# Patient Record
Sex: Female | Born: 1985 | Race: White | Hispanic: No | Marital: Married | Smoking: Former smoker
Health system: Southern US, Community
[De-identification: ages and names within clinical notes are randomized; demographics above are authoritative.]

## PROBLEM LIST (undated history)

## (undated) DIAGNOSIS — G43909 Migraine, unspecified, not intractable, without status migrainosus: Secondary | ICD-10-CM

## (undated) DIAGNOSIS — N8110 Cystocele, unspecified: Secondary | ICD-10-CM

## (undated) DIAGNOSIS — K121 Other forms of stomatitis: Secondary | ICD-10-CM

## (undated) DIAGNOSIS — K219 Gastro-esophageal reflux disease without esophagitis: Secondary | ICD-10-CM

## (undated) HISTORY — PX: NISSEN FUNDOPLICATION: SHX2091

## (undated) HISTORY — PX: LAPAROTOMY: SHX154

## (undated) HISTORY — PX: TONSILLECTOMY: SUR1361

## (undated) HISTORY — PX: LAPAROSCOPIC ENDOMETRIOSIS FULGURATION: SUR769

---

## 2014-12-31 ENCOUNTER — Other Ambulatory Visit: Payer: Self-pay | Admitting: Physician Assistant

## 2014-12-31 DIAGNOSIS — R1084 Generalized abdominal pain: Secondary | ICD-10-CM

## 2015-01-06 ENCOUNTER — Ambulatory Visit
Admission: RE | Admit: 2015-01-06 | Discharge: 2015-01-06 | Disposition: A | Discharge status: Home / Self Care | Payer: Managed Care, Other (non HMO) | Source: Ambulatory Visit | Attending: Physician Assistant | Admitting: Physician Assistant

## 2015-01-06 DIAGNOSIS — R1084 Generalized abdominal pain: Secondary | ICD-10-CM

## 2015-01-06 IMAGING — RF DG UGI W/ HIGH DENSITY W/KUB
18 series · 18 of 18 positions shown · non-contrast
Comparison: None.

CLINICAL DATA: Abdominal pain after eating. History of Nissen
fundoplication.

EXAM:
UPPER GI SERIES WITH KUB
TECHNIQUE: After obtaining a scout radiograph a routine upper GI series was
performed using thin and high density barium.
FLUOROSCOPY TIME:  Radiation Exposure Index (as provided by the
fluoroscopic device): 42 dGy
Fluoroscopy Time (in minutes and seconds):  2 minutes 0 seconds

[Series 1: run · 1 of 1 slices shown (1 of 17)]
[im 1/1]
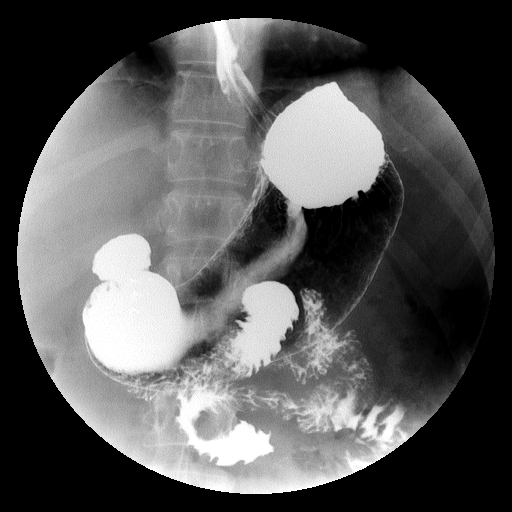

[Series 2: run · 1 of 1 slices shown (2 of 17)]
[im 1/1]
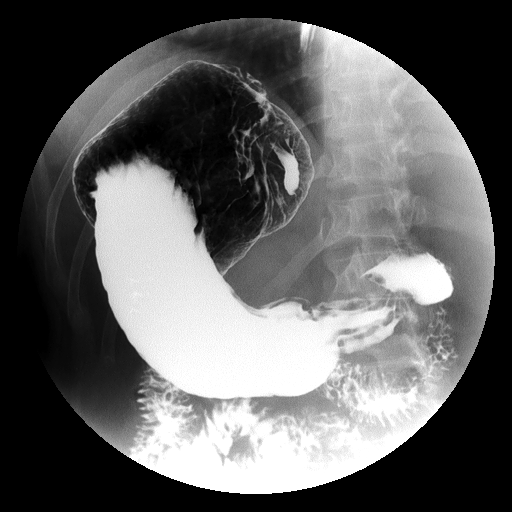

[Series 3: run · 1 of 1 slices shown (3 of 17)]
[im 1/1]
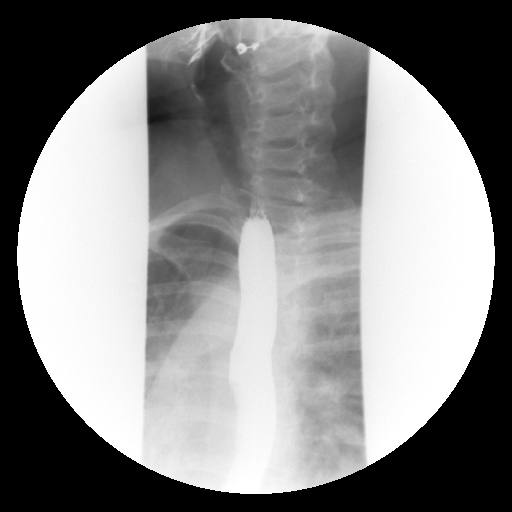

[Series 4: run · 1 of 1 slices shown (4 of 17)]
[im 1/1]
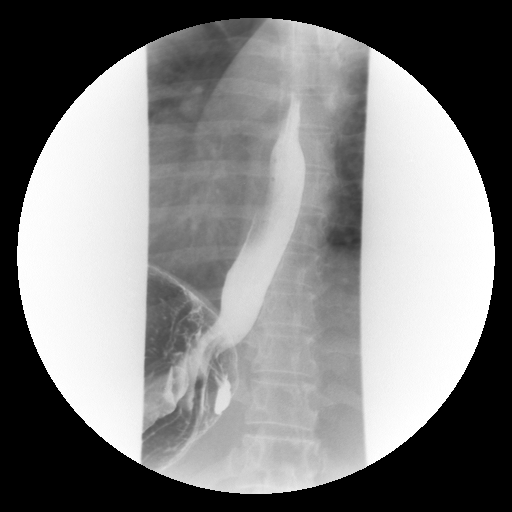

[Series 5: run · 1 of 1 slices shown (5 of 17)]
[im 1/1]
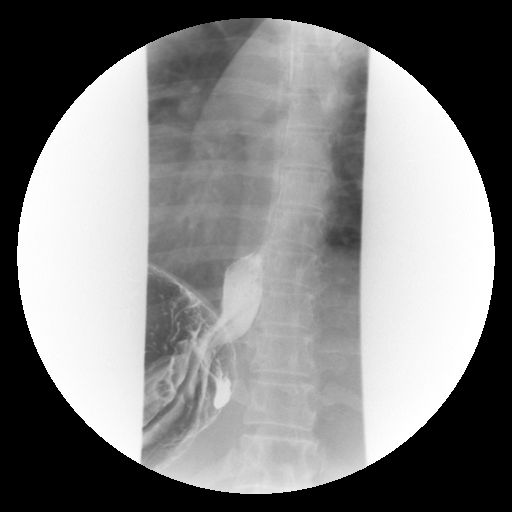

[Series 6: run · 1 of 1 slices shown (6 of 17)]
[im 1/1]
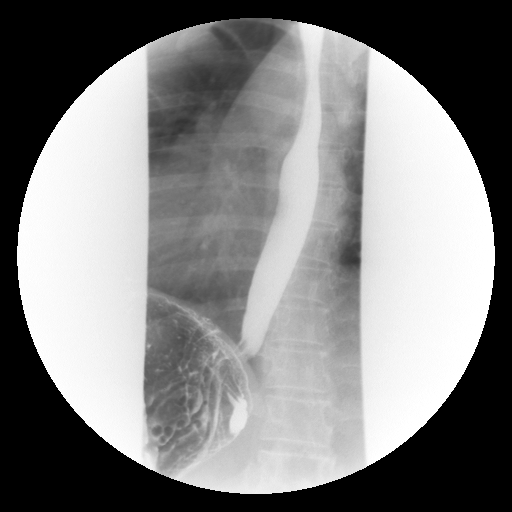

[Series 7: run · 1 of 1 slices shown (7 of 17)]
[im 1/1]
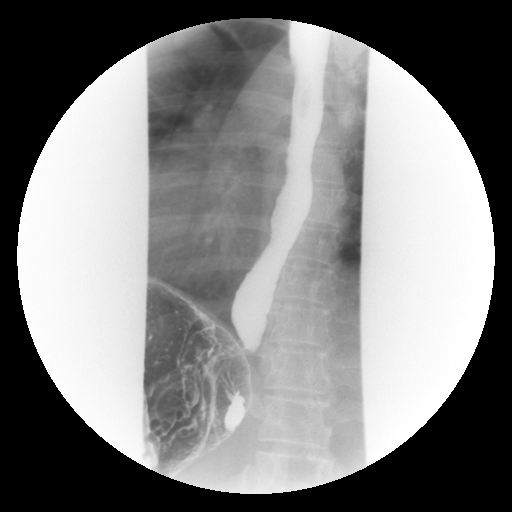

[Series 8: run · 1 of 1 slices shown (8 of 17)]
[im 1/1]
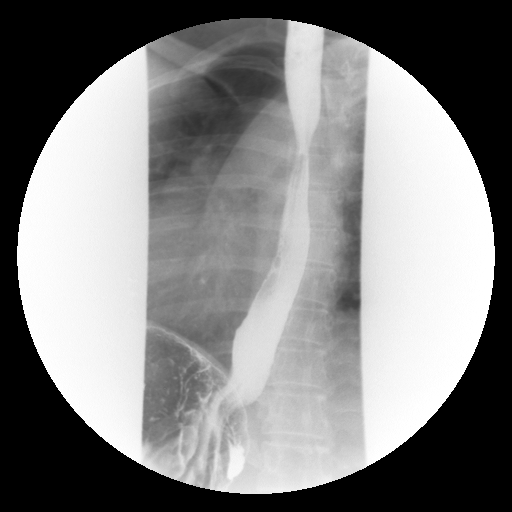

[Series 9: run · 1 of 1 slices shown (9 of 17)]
[im 1/1]
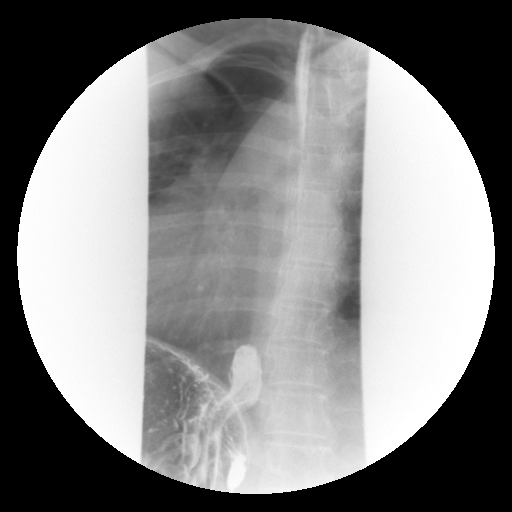

[Series 10: run · 1 of 1 slices shown (10 of 17)]
[im 1/1]
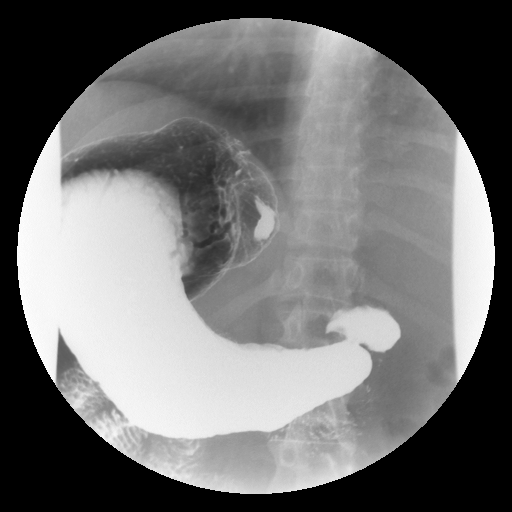

[Series 11: run · 1 of 1 slices shown (11 of 17)]
[im 1/1]
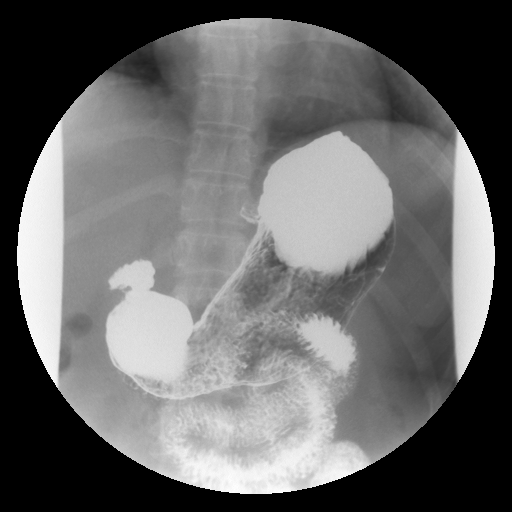

[Series 12: run · 1 of 1 slices shown (12 of 17)]
[im 1/1]
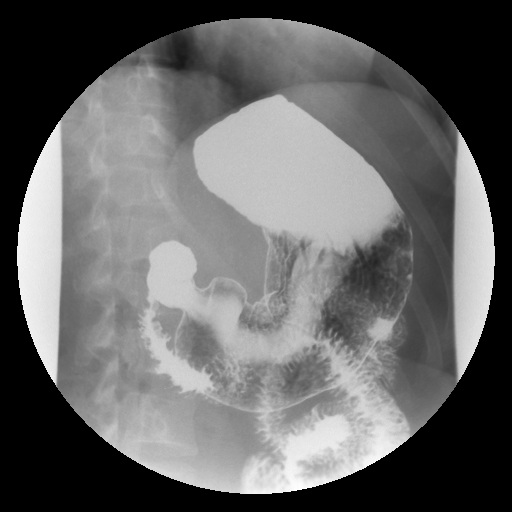

[Series 13: run · 1 of 1 slices shown (13 of 17)]
[im 1/1]
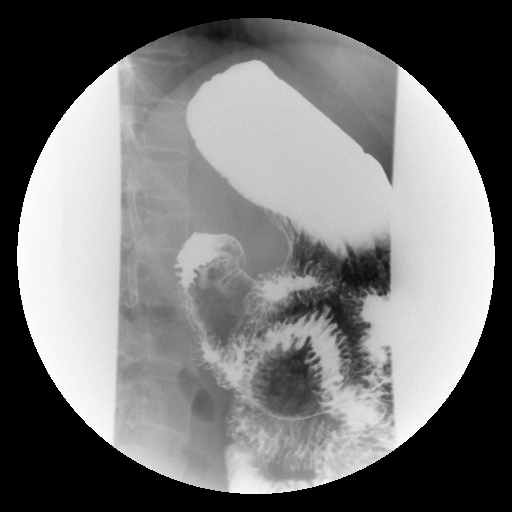

[Series 14: run · 1 of 1 slices shown (14 of 17)]
[im 1/1]
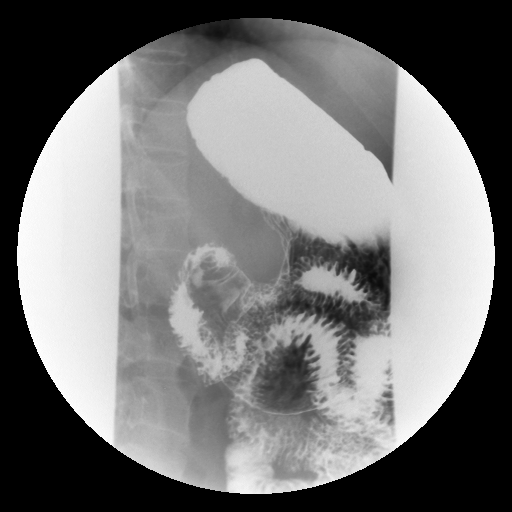

[Series 15: run · 1 of 1 slices shown (15 of 17)]
[im 1/1]
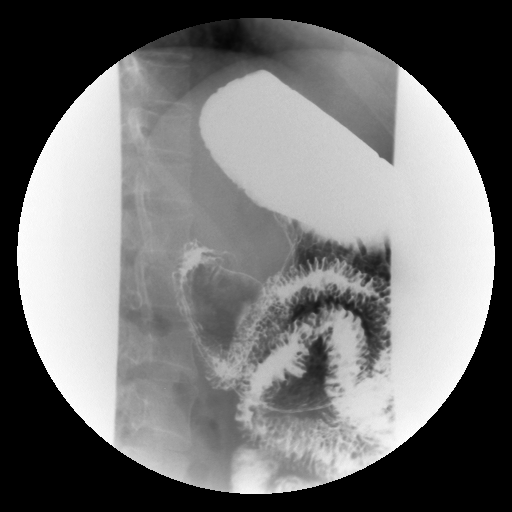

[Series 16: run · 1 of 1 slices shown (16 of 17)]
[im 1/1]
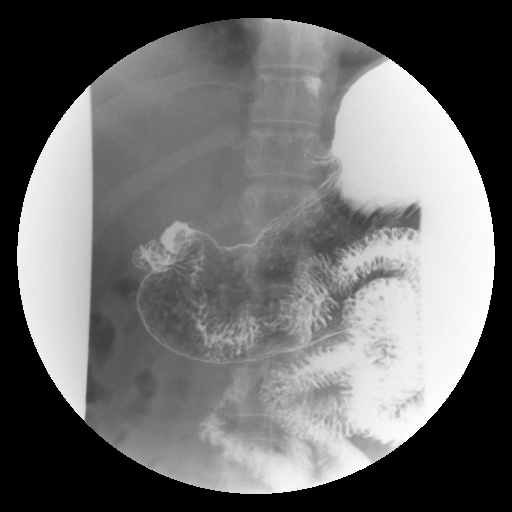

[Series 17: run · 1 of 1 slices shown (17 of 17)]
[im 1/1]
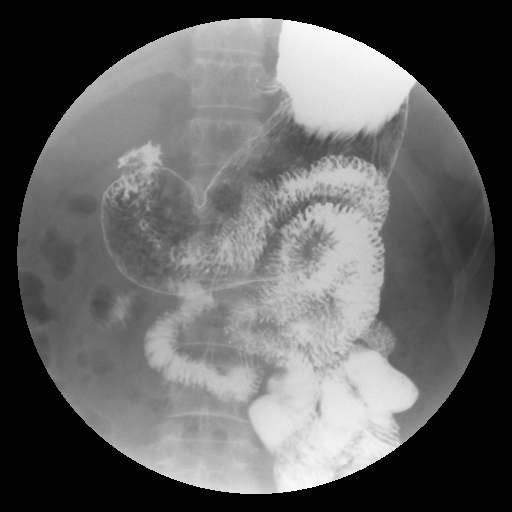

[Series 1001: view not recorded · 0.20mm/px · 1 of 1 slices shown]
[im 1/1]
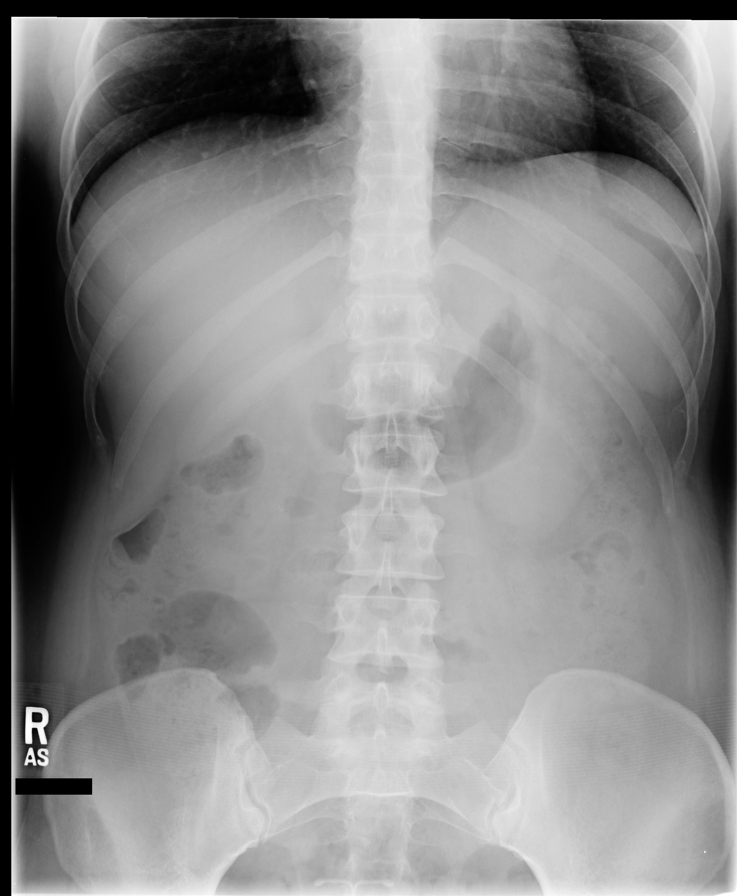

[18 of 18 positions shown; findings below may reference images not displayed]

FINDINGS: The mucosa and motility of the esophagus are normal. No hiatal
hernia. Nissen fundoplication appears intact. The stomach otherwise
appears normal. Pylorus and duodenal bulb and duodenal C-loop are
normal. Proximal jejunum is normal.
IMPRESSION: Intact fundoplication with no recurrent hiatal hernia. No
spontaneous gastroesophageal reflux or visible esophagitis.

Otherwise, normal exam.

## 2015-01-13 ENCOUNTER — Other Ambulatory Visit: Payer: Self-pay | Admitting: Physician Assistant

## 2015-01-13 DIAGNOSIS — R1084 Generalized abdominal pain: Secondary | ICD-10-CM

## 2015-01-16 ENCOUNTER — Ambulatory Visit
Admission: RE | Admit: 2015-01-16 | Discharge: 2015-01-16 | Disposition: A | Discharge status: Home / Self Care | Payer: Managed Care, Other (non HMO) | Source: Ambulatory Visit | Attending: Physician Assistant | Admitting: Physician Assistant

## 2015-01-16 DIAGNOSIS — R1084 Generalized abdominal pain: Secondary | ICD-10-CM

## 2018-12-07 ENCOUNTER — Ambulatory Visit (HOSPITAL_BASED_OUTPATIENT_CLINIC_OR_DEPARTMENT_OTHER): Admit: 2018-12-07 | Payer: Managed Care, Other (non HMO) | Admitting: Obstetrics and Gynecology

## 2018-12-07 ENCOUNTER — Encounter (HOSPITAL_BASED_OUTPATIENT_CLINIC_OR_DEPARTMENT_OTHER): Payer: Self-pay

## 2018-12-07 SURGERY — ANTERIOR (CYSTOCELE) AND POSTERIOR REPAIR (RECTOCELE)
Anesthesia: General | Laterality: Bilateral

## 2019-01-01 NOTE — Patient Instructions (Addendum)
YOU ARE REQUIRED TO BE TESTED FOR COVID-19 PRIOR TO YOUR SURGERY . YOUR TEST MUST BE COMPLETED ON Saturday June 20th. TESTING IS LOCATED [facility] FROM 9:00AM - 12:00PM. FAILURE TO COMPLETE TESTING MAY RESULT IN CANCELLATION OF YOUR SURGERY.     Savannah Bolton      Your procedure is scheduled on 01-10-2019   [facility].   Call this number if you have problems the morning of [phone]    OUR ADDRESS IS [address], WE ARE LOCATED IN THE [facility]ICAL [facility] WITH [facility].   Remember: Do not eat food After Midnight. YOU MAY HAVE CLEAR LIQUIDS FROM MIDNIGHT UNTIL 4:30AM. At 4:30AM Please finish the prescribed Pre-Surgery ENSURE drink. Nothing by mouth after you finish the ENSURE drink !    Take these medicines the morning of surgery with A SIP OF WATER :NONE           CLEAR LIQUID DIET   Foods Allowed                                                                     Foods Excluded  Coffee and tea, regular and decaf                             liquids that you cannot  Plain Jell-O in any flavor                                             see through such as: Fruit ices (not with fruit pulp)                                     milk, soups, orange juice  Iced Popsicles                                    All solid food Carbonated beverages, regular and diet                                    Cranberry, grape and apple juices Sports drinks like Gatorade Lightly seasoned clear broth or consume(fat free) Sugar, honey syrup  Sample Menu Breakfast                                Lunch                                     Supper Cranberry juice                    Beef broth  Chicken broth Jell-O                                     Grape juice                           Apple juice Coffee or tea                        Jell-O                                       Popsicle                                                Coffee or tea                        Coffee or tea  _____________________________________________________________________    Do not wear jewelry, make-up or nail polish.  Do not wear lotions, powders, or perfumes, or deoderant.  Do not shave 48 hours prior to surgery.  Men may shave face and neck.  Do not bring valuables to the hospital.  [facility] any belongings or valuables.  Contacts, dentures or bridgework may not be worn into surgery.  Leave your suitcase in the car.  After surgery it may be brought to your room.  For patients admitted to the hospital, discharge time will be determined by your treatment team.  Patients discharged the day of surgery will not be allowed to drive home.   Special instructions: PLEASE BRING ALL PRESCIPTION MEDICATIONS WITH YOU IN THE ORIGINAL CONTAINERS, IF YOU ARE TAKING ANY PRESCRIPTIONS MEDICATIONS. NO VISITORS ARE ALLOWED TO SPEND THE NIGHT.  Please read over the following fact sheets that you were given:   [facility] - Preparing for [facility] Before surgery, you can play an important role.  Because skin is not sterile, your skin needs to be as free of germs as possible.  You can reduce the number of germs on your skin by washing with CHG (chlorahexidine gluconate) soap before surgery.  CHG is an antiseptic cleaner which kills germs and bonds with the skin to continue killing germs even after washing. Please DO NOT use if you have an allergy to CHG or antibacterial soaps.  If your skin becomes reddened/irritated stop using the CHG and inform your nurse when you arrive at Short Stay. Do not shave (including legs and underarms) for at least 48 hours prior to the first CHG shower.  You may shave your face/neck. Please follow these instructions carefully:  1.  Shower with CHG Soap the night before surgery and the  morning of Surgery.  2.  If you choose to wash your hair,  wash your hair first as usual with your  normal  shampoo.  3.  After you shampoo, rinse your hair and body thoroughly to remove the  shampoo.                           4.  Use CHG as you would any other liquid soap.  You can apply chg directly  to the skin and wash                       Gently with a scrungie or clean washcloth.  5.  Apply the CHG Soap to your body ONLY FROM THE NECK DOWN.   Do not use on face/ open                           Wound or open sores. Avoid contact with eyes, ears mouth and genitals (private parts).                       Wash face,  Genitals (private parts) with your normal soap.             6.  Wash thoroughly, paying special attention to the area where your surgery  will be performed.  7.  Thoroughly rinse your body with warm water from the neck down.  8.  DO NOT shower/wash with your normal soap after using and rinsing off  the CHG Soap.                9.  Pat yourself dry with a clean towel.            10.  Wear clean pajamas.            11.  Place clean sheets on your bed the night of your first shower and do not  sleep with pets. Day of Surgery : Do not apply any lotions/deodorants the morning of surgery.  Please wear clean clothes to the hospital/surgery center.  FAILURE TO FOLLOW THESE INSTRUCTIONS MAY RESULT IN THE CANCELLATION OF YOUR SURGERY PATIENT SIGNATURE_________________________________  NURSE SIGNATURE__________________________________  ________________________________________________________________________   [name]  An incentive spirometer is a tool that can help keep your lungs clear and active. This tool measures how well you are filling your lungs with each breath. Taking long deep breaths may help reverse or decrease the chance of developing breathing (pulmonary) problems (especially infection) following:  A long period of time when you are unable to move or be active. BEFORE THE PROCEDURE   If the spirometer includes an  indicator to show your best effort, your nurse or respiratory therapist will set it to a desired goal.  If possible, sit up straight or lean slightly forward. Try not to slouch.  Hold the incentive spirometer in an upright position. INSTRUCTIONS FOR USE  1. Sit on the edge of your bed if possible, or sit up as far as you can in bed or on a chair. 2. Hold the incentive spirometer in an upright position. 3. Breathe out normally. 4. Place the mouthpiece in your mouth and seal your lips tightly around it. 5. Breathe in slowly and as deeply as possible, raising the piston or the ball toward the top of the column. 6. Hold your breath for 3-5 seconds or for as long as possible. Allow the piston or ball to fall to the bottom of the column. 7. Remove the mouthpiece from your mouth and breathe out normally. 8. Rest for a few seconds and repeat Steps 1 through 7 at least 10 times every 1-2 hours when you are awake. Take your time and take a few normal breaths between deep breaths. 9. The spirometer may include an indicator to show your best effort. Use the indicator as a goal to work toward  during each repetition. 10. After each set of 10 deep breaths, practice coughing to be sure your lungs are clear. If you have an incision (the cut made at the time of surgery), support your incision when coughing by placing a pillow or rolled up towels firmly against it. Once you are able to get out of bed, walk around indoors and cough well. You may stop using the incentive spirometer when instructed by your caregiver.  RISKS AND COMPLICATIONS  Take your time so you do not get dizzy or light-headed.  If you are in pain, you may need to take or ask for pain medication before doing incentive spirometry. It is harder to take a deep breath if you are having pain. AFTER USE  Rest and breathe slowly and easily.  It can be helpful to keep track of a log of your progress. Your caregiver can provide you with a simple table  to help with this. If you are using the spirometer at home, follow these instructions: SEEK MEDICAL CARE IF:   You are having difficultly using the spirometer.  You have trouble using the spirometer as often as instructed.  Your pain medication is not giving enough relief while using the spirometer.  You develop fever of 100.5 F (38.1 C) or higher. SEEK IMMEDIATE MEDICAL CARE IF:   You cough up bloody sputum that had not been present before.  You develop fever of 102 F (38.9 C) or greater.  You develop worsening pain at or near the incision site. MAKE SURE YOU:   Understand these instructions.  Will watch your condition.  Will get help right away if you are not doing well or get worse. Document Released: 11/15/2006 Document Revised: 09/27/2011 Document Reviewed: 01/16/2007 [facility] Patient Information 2014 [employer] [place].   ________________________________________________________________________

## 2019-01-02 ENCOUNTER — Encounter (HOSPITAL_COMMUNITY): Payer: Self-pay

## 2019-01-02 ENCOUNTER — Encounter (HOSPITAL_COMMUNITY)
Admission: RE | Admit: 2019-01-02 | Discharge: 2019-01-02 | Disposition: A | Discharge status: Home / Self Care | Payer: 59 | Source: Ambulatory Visit | Attending: Obstetrics and Gynecology | Admitting: Obstetrics and Gynecology

## 2019-01-02 ENCOUNTER — Other Ambulatory Visit: Payer: Self-pay

## 2019-01-02 DIAGNOSIS — Z01812 Encounter for preprocedural laboratory examination: Secondary | ICD-10-CM | POA: Insufficient documentation

## 2019-01-02 DIAGNOSIS — N8110 Cystocele, unspecified: Secondary | ICD-10-CM | POA: Diagnosis not present

## 2019-01-02 DIAGNOSIS — R102 Pelvic and perineal pain: Secondary | ICD-10-CM | POA: Diagnosis not present

## 2019-01-02 HISTORY — DX: Cystocele, unspecified: N81.10

## 2019-01-02 HISTORY — DX: Gastro-esophageal reflux disease without esophagitis: K21.9

## 2019-01-02 HISTORY — DX: Other forms of stomatitis: K12.1

## 2019-01-02 HISTORY — DX: Migraine, unspecified, not intractable, without status migrainosus: G43.909

## 2019-01-02 LAB — CBC
HCT: 42.3 % (ref 36.0–46.0)
Hemoglobin: 14 g/dL (ref 12.0–15.0)
MCH: 30.7 pg (ref 26.0–34.0)
MCHC: 33.1 g/dL (ref 30.0–36.0)
MCV: 92.8 fL (ref 80.0–100.0)
Platelets: 233 10*3/uL (ref 150–400)
RBC: 4.56 MIL/uL (ref 3.87–5.11)
RDW: 13.1 % (ref 11.5–15.5)
WBC: 8.3 10*3/uL (ref 4.0–10.5)
nRBC: 0 % (ref 0.0–0.2)

## 2019-01-06 ENCOUNTER — Other Ambulatory Visit (HOSPITAL_COMMUNITY)
Admission: RE | Admit: 2019-01-06 | Discharge: 2019-01-06 | Disposition: A | Discharge status: Home / Self Care | Payer: 59 | Source: Ambulatory Visit | Attending: Obstetrics and Gynecology | Admitting: Obstetrics and Gynecology

## 2019-01-06 DIAGNOSIS — Z1159 Encounter for screening for other viral diseases: Secondary | ICD-10-CM | POA: Diagnosis not present

## 2019-01-07 LAB — SARS CORONAVIRUS 2 (TAT 6-24 HRS): SARS Coronavirus 2: NEGATIVE

## 2019-01-09 NOTE — H&P (Signed)
Savannah Bolton is an 33 y.o. female.  Since Savannah Bolton 2 years ago, bladder has dropped, has some vaginal pressure, frequent random urine leak-wears panty liner and has to change several times per day.  Has Mirena, over the past 6 months, started having light menses monthly, then over the past couple of months bleeding every 2 weeks. Has had some random cramps. Recent had worse midline pelvic pain, some better after ibuprofen and tylenol. Pain was 8/10, now 3/10. No specific aggravating or relieving factors. Normal urination and BM. No F/C, some nausea with pain, no emesis. No unusual discharge or irritation. Last coitus 2-3 months ago, no problems then.  She was initially scheduled for A&P repair and BTL, but with increased bleeding and pain, wants definitive surgical management of that as well.  Pertinent Gynecological History: Last pap: normal Date: 2017, pending from 01/01/2019 OB History: G2, P2002 SVD x 2   Menstrual History: Patient's last menstrual period was 12/19/2018.    Past Medical History:  Diagnosis Date  . Female cystocele   . GERD (gastroesophageal reflux disease)   . Migraines   . Mouth ulcers    due to high acid buildup leads to mouth ulcers, reports healing now     No family history on file.  Social History:  reports that she quit smoking about 3 years ago. Her smoking use included cigarettes. She has never used smokeless tobacco. She reports current alcohol use. No history on file for drug.  Allergies: No Known Allergies  No medications prior to admission.    Review of Systems  Respiratory: Negative.   Cardiovascular: Negative.     Last menstrual period 12/19/2018. Physical Exam  Constitutional: She appears well-developed and well-nourished.  Neck: Neck supple. No thyromegaly present.  Cardiovascular: Normal rate, regular rhythm and normal heart sounds.  No murmur heard. Respiratory: Effort normal and breath sounds normal. No respiratory distress. She has no  wheezes.  GI: Soft. She exhibits no distension and no mass. There is no abdominal tenderness.  Genitourinary:    Uterus normal.     Genitourinary Comments: Gr II cystocele, Gr I rectocele No adnexal mass     No results found for this or any previous visit (from the past 24 hour(s)).  No results found.  Assessment/Plan: Symptomatic cystocele, small rectocele, irregular bleeding and increasing pelvic pain.  All medical and surgical options have been discussed.  She wants definitive therapy.  Surgical procedure, risks, chances of relieving symptoms have all been discussed.  Will admit for LAVH,bilateral salpingectomy,  A&P repair, cystoscopy.    Blane Ohara Savannah Bolton 01/09/2019, 7:33 PM

## 2019-01-10 ENCOUNTER — Ambulatory Visit (HOSPITAL_BASED_OUTPATIENT_CLINIC_OR_DEPARTMENT_OTHER)
Admission: RE | Admit: 2019-01-10 | Discharge: 2019-01-11 | Disposition: A | Discharge status: Home / Self Care | Payer: 59 | Attending: Obstetrics and Gynecology | Admitting: Obstetrics and Gynecology

## 2019-01-10 ENCOUNTER — Other Ambulatory Visit: Payer: Self-pay

## 2019-01-10 ENCOUNTER — Ambulatory Visit (HOSPITAL_BASED_OUTPATIENT_CLINIC_OR_DEPARTMENT_OTHER): Payer: 59 | Admitting: Anesthesiology

## 2019-01-10 ENCOUNTER — Encounter (HOSPITAL_BASED_OUTPATIENT_CLINIC_OR_DEPARTMENT_OTHER): Payer: Self-pay

## 2019-01-10 ENCOUNTER — Ambulatory Visit (HOSPITAL_BASED_OUTPATIENT_CLINIC_OR_DEPARTMENT_OTHER): Payer: 59 | Admitting: Physician Assistant

## 2019-01-10 ENCOUNTER — Encounter (HOSPITAL_BASED_OUTPATIENT_CLINIC_OR_DEPARTMENT_OTHER)
Admission: RE | Disposition: A | Discharge status: Home / Self Care | Payer: Self-pay | Source: Home / Self Care | Attending: Obstetrics and Gynecology

## 2019-01-10 DIAGNOSIS — N816 Rectocele: Secondary | ICD-10-CM | POA: Diagnosis not present

## 2019-01-10 DIAGNOSIS — N814 Uterovaginal prolapse, unspecified: Secondary | ICD-10-CM | POA: Diagnosis present

## 2019-01-10 DIAGNOSIS — Z87891 Personal history of nicotine dependence: Secondary | ICD-10-CM | POA: Diagnosis not present

## 2019-01-10 DIAGNOSIS — N800 Endometriosis of uterus: Secondary | ICD-10-CM | POA: Diagnosis not present

## 2019-01-10 DIAGNOSIS — R102 Pelvic and perineal pain: Secondary | ICD-10-CM | POA: Diagnosis present

## 2019-01-10 DIAGNOSIS — Z90710 Acquired absence of both cervix and uterus: Secondary | ICD-10-CM | POA: Diagnosis present

## 2019-01-10 DIAGNOSIS — N8110 Cystocele, unspecified: Secondary | ICD-10-CM | POA: Diagnosis not present

## 2019-01-10 DIAGNOSIS — N838 Other noninflammatory disorders of ovary, fallopian tube and broad ligament: Secondary | ICD-10-CM | POA: Insufficient documentation

## 2019-01-10 DIAGNOSIS — N939 Abnormal uterine and vaginal bleeding, unspecified: Secondary | ICD-10-CM | POA: Insufficient documentation

## 2019-01-10 HISTORY — PX: ANTERIOR AND POSTERIOR REPAIR: SHX5121

## 2019-01-10 HISTORY — PX: CYSTOSCOPY: SHX5120

## 2019-01-10 HISTORY — PX: LAPAROSCOPIC VAGINAL HYSTERECTOMY WITH SALPINGECTOMY: SHX6680

## 2019-01-10 LAB — POCT PREGNANCY, URINE: Preg Test, Ur: NEGATIVE

## 2019-01-10 SURGERY — ANTERIOR (CYSTOCELE) AND POSTERIOR REPAIR (RECTOCELE)
Anesthesia: General | Site: Vagina

## 2019-01-10 MED ORDER — MIDAZOLAM HCL 2 MG/2ML IJ SOLN
INTRAMUSCULAR | Status: DC | PRN
  Administered 2019-01-10: 2 mg via INTRAVENOUS

## 2019-01-10 MED ORDER — DEXAMETHASONE SODIUM PHOSPHATE 10 MG/ML IJ SOLN
INTRAMUSCULAR | Status: DC | PRN
  Administered 2019-01-10: 10 mg via INTRAVENOUS

## 2019-01-10 MED ORDER — SUGAMMADEX SODIUM 200 MG/2ML IV SOLN
INTRAVENOUS | Status: DC | PRN
  Administered 2019-01-10: 100 mg via INTRAVENOUS

## 2019-01-10 MED ORDER — HYDROMORPHONE HCL 1 MG/ML IJ SOLN
1.0000 mg | Freq: Once | INTRAMUSCULAR | Status: AC | PRN
  Administered 2019-01-10: 14:00:00 1 mg via INTRAVENOUS
  Filled 2019-01-10: qty 1

## 2019-01-10 MED ORDER — ACETAMINOPHEN 500 MG PO TABS
ORAL_TABLET | ORAL | Status: AC
  Filled 2019-01-10: qty 2

## 2019-01-10 MED ORDER — DEXAMETHASONE SODIUM PHOSPHATE 10 MG/ML IJ SOLN
INTRAMUSCULAR | Status: AC
  Filled 2019-01-10: qty 1

## 2019-01-10 MED ORDER — FLUORESCEIN SODIUM 10 % IV SOLN
INTRAVENOUS | Status: AC
  Filled 2019-01-10: qty 5

## 2019-01-10 MED ORDER — HYDROMORPHONE HCL 1 MG/ML IJ SOLN
INTRAMUSCULAR | Status: AC
  Filled 2019-01-10: qty 1

## 2019-01-10 MED ORDER — BUPIVACAINE HCL (PF) 0.5 % IJ SOLN
INTRAMUSCULAR | Status: DC | PRN
  Administered 2019-01-10: 7.5 mL

## 2019-01-10 MED ORDER — MIDAZOLAM HCL 2 MG/2ML IJ SOLN
INTRAMUSCULAR | Status: AC
  Filled 2019-01-10: qty 2

## 2019-01-10 MED ORDER — FENTANYL CITRATE (PF) 100 MCG/2ML IJ SOLN
INTRAMUSCULAR | Status: AC
  Filled 2019-01-10: qty 2

## 2019-01-10 MED ORDER — ONDANSETRON HCL 4 MG PO TABS
4.0000 mg | ORAL_TABLET | Freq: Four times a day (QID) | ORAL | Status: DC | PRN
  Filled 2019-01-10: qty 1

## 2019-01-10 MED ORDER — VASOPRESSIN 20 UNIT/ML IV SOLN
INTRAVENOUS | Status: DC | PRN
  Administered 2019-01-10: 13 mL via INTRAMUSCULAR

## 2019-01-10 MED ORDER — GABAPENTIN 300 MG PO CAPS
ORAL_CAPSULE | ORAL | Status: AC
  Filled 2019-01-10: qty 1

## 2019-01-10 MED ORDER — SUGAMMADEX SODIUM 200 MG/2ML IV SOLN
INTRAVENOUS | Status: AC
  Filled 2019-01-10: qty 2

## 2019-01-10 MED ORDER — LIDOCAINE 2% (20 MG/ML) 5 ML SYRINGE
INTRAMUSCULAR | Status: AC
  Filled 2019-01-10: qty 5

## 2019-01-10 MED ORDER — KETOROLAC TROMETHAMINE 30 MG/ML IJ SOLN
30.0000 mg | Freq: Once | INTRAMUSCULAR | Status: DC
  Filled 2019-01-10: qty 1

## 2019-01-10 MED ORDER — SIMETHICONE 80 MG PO CHEW
80.0000 mg | CHEWABLE_TABLET | Freq: Four times a day (QID) | ORAL | Status: DC | PRN
  Filled 2019-01-10: qty 1

## 2019-01-10 MED ORDER — ALUM & MAG HYDROXIDE-SIMETH 200-200-20 MG/5ML PO SUSP
30.0000 mL | ORAL | Status: DC | PRN
  Filled 2019-01-10: qty 30

## 2019-01-10 MED ORDER — ONDANSETRON HCL 4 MG/2ML IJ SOLN
INTRAMUSCULAR | Status: AC
  Filled 2019-01-10: qty 2

## 2019-01-10 MED ORDER — IBUPROFEN 800 MG PO TABS
800.0000 mg | ORAL_TABLET | Freq: Four times a day (QID) | ORAL | Status: DC
  Filled 2019-01-10: qty 1

## 2019-01-10 MED ORDER — KETOROLAC TROMETHAMINE 30 MG/ML IJ SOLN
30.0000 mg | Freq: Four times a day (QID) | INTRAMUSCULAR | Status: DC
  Administered 2019-01-10 – 2019-01-11 (×3): 30 mg via INTRAVENOUS
  Filled 2019-01-10: qty 1

## 2019-01-10 MED ORDER — LIDOCAINE 2% (20 MG/ML) 5 ML SYRINGE
INTRAMUSCULAR | Status: DC | PRN
  Administered 2019-01-10: 1.5 mg/kg/h via INTRAVENOUS

## 2019-01-10 MED ORDER — HYDROMORPHONE HCL 1 MG/ML IJ SOLN
0.2500 mg | INTRAMUSCULAR | Status: DC | PRN
  Administered 2019-01-10: 19:00:00 0.5 mg via INTRAVENOUS
  Filled 2019-01-10: qty 0.5

## 2019-01-10 MED ORDER — OXYCODONE HCL 5 MG PO TABS
ORAL_TABLET | ORAL | Status: AC
  Filled 2019-01-10: qty 2

## 2019-01-10 MED ORDER — SUCCINYLCHOLINE CHLORIDE 200 MG/10ML IV SOSY
PREFILLED_SYRINGE | INTRAVENOUS | Status: DC | PRN
  Administered 2019-01-10: 100 mg via INTRAVENOUS

## 2019-01-10 MED ORDER — PROMETHAZINE HCL 25 MG/ML IJ SOLN
6.2500 mg | INTRAMUSCULAR | Status: DC | PRN
  Filled 2019-01-10: qty 1

## 2019-01-10 MED ORDER — BUPIVACAINE HCL (PF) 0.25 % IJ SOLN
INTRAMUSCULAR | Status: DC | PRN
  Administered 2019-01-10: 9 mL

## 2019-01-10 MED ORDER — KETAMINE HCL 10 MG/ML IJ SOLN
INTRAMUSCULAR | Status: AC
  Filled 2019-01-10: qty 1

## 2019-01-10 MED ORDER — BISACODYL 10 MG RE SUPP
10.0000 mg | Freq: Every day | RECTAL | Status: DC | PRN
  Filled 2019-01-10: qty 1

## 2019-01-10 MED ORDER — ONDANSETRON HCL 4 MG/2ML IJ SOLN
4.0000 mg | Freq: Four times a day (QID) | INTRAMUSCULAR | Status: DC | PRN
  Filled 2019-01-10: qty 2

## 2019-01-10 MED ORDER — ACETAMINOPHEN 500 MG PO TABS
1000.0000 mg | ORAL_TABLET | Freq: Four times a day (QID) | ORAL | Status: DC
  Administered 2019-01-10 – 2019-01-11 (×3): 1000 mg via ORAL
  Filled 2019-01-10: qty 2

## 2019-01-10 MED ORDER — LIDOCAINE 2% (20 MG/ML) 5 ML SYRINGE
INTRAMUSCULAR | Status: DC | PRN
  Administered 2019-01-10: 60 mg via INTRAVENOUS

## 2019-01-10 MED ORDER — SODIUM CHLORIDE 0.9 % IR SOLN
Status: DC | PRN
  Administered 2019-01-10: 100 mL

## 2019-01-10 MED ORDER — PROPOFOL 10 MG/ML IV BOLUS
INTRAVENOUS | Status: DC | PRN
  Administered 2019-01-10: 150 mg via INTRAVENOUS

## 2019-01-10 MED ORDER — FENTANYL CITRATE (PF) 100 MCG/2ML IJ SOLN
INTRAMUSCULAR | Status: DC | PRN
  Administered 2019-01-10: 50 ug via INTRAVENOUS
  Administered 2019-01-10 (×2): 100 ug via INTRAVENOUS
  Administered 2019-01-10 (×2): 50 ug via INTRAVENOUS

## 2019-01-10 MED ORDER — KETOROLAC TROMETHAMINE 30 MG/ML IJ SOLN
INTRAMUSCULAR | Status: DC | PRN
  Administered 2019-01-10: 30 mg via INTRAVENOUS

## 2019-01-10 MED ORDER — SCOPOLAMINE 1 MG/3DAYS TD PT72
1.0000 | MEDICATED_PATCH | TRANSDERMAL | Status: DC
  Administered 2019-01-10: 10:00:00 1.5 mg via TRANSDERMAL
  Filled 2019-01-10: qty 1

## 2019-01-10 MED ORDER — HYDROMORPHONE HCL 1 MG/ML IJ SOLN
1.0000 mg | Freq: Once | INTRAMUSCULAR | Status: AC
  Administered 2019-01-10: 14:00:00 1 mg via INTRAVENOUS
  Filled 2019-01-10: qty 1

## 2019-01-10 MED ORDER — FENTANYL CITRATE (PF) 250 MCG/5ML IJ SOLN
INTRAMUSCULAR | Status: AC
  Filled 2019-01-10: qty 5

## 2019-01-10 MED ORDER — ARTIFICIAL TEARS OPHTHALMIC OINT
TOPICAL_OINTMENT | OPHTHALMIC | Status: AC
  Filled 2019-01-10: qty 3.5

## 2019-01-10 MED ORDER — ROCURONIUM BROMIDE 10 MG/ML (PF) SYRINGE
PREFILLED_SYRINGE | INTRAVENOUS | Status: DC | PRN
  Administered 2019-01-10: 70 mg via INTRAVENOUS

## 2019-01-10 MED ORDER — DEXTROSE-NACL 5-0.45 % IV SOLN
INTRAVENOUS | Status: DC
  Administered 2019-01-10 (×2): via INTRAVENOUS
  Filled 2019-01-10 (×2): qty 1000

## 2019-01-10 MED ORDER — CEFAZOLIN SODIUM-DEXTROSE 2-4 GM/100ML-% IV SOLN
INTRAVENOUS | Status: AC
  Filled 2019-01-10: qty 100

## 2019-01-10 MED ORDER — KETOROLAC TROMETHAMINE 30 MG/ML IJ SOLN
INTRAMUSCULAR | Status: AC
  Filled 2019-01-10: qty 1

## 2019-01-10 MED ORDER — ROCURONIUM BROMIDE 10 MG/ML (PF) SYRINGE
PREFILLED_SYRINGE | INTRAVENOUS | Status: AC
  Filled 2019-01-10: qty 10

## 2019-01-10 MED ORDER — FLUORESCEIN SODIUM 10 % IV SOLN
INTRAVENOUS | Status: DC | PRN
  Administered 2019-01-10: 100 mg via INTRAVENOUS

## 2019-01-10 MED ORDER — CEFAZOLIN SODIUM-DEXTROSE 2-4 GM/100ML-% IV SOLN
2.0000 g | INTRAVENOUS | Status: AC
  Administered 2019-01-10: 11:00:00 2 g via INTRAVENOUS
  Filled 2019-01-10: qty 100

## 2019-01-10 MED ORDER — GABAPENTIN 300 MG PO CAPS
300.0000 mg | ORAL_CAPSULE | Freq: Three times a day (TID) | ORAL | Status: DC
  Administered 2019-01-10 – 2019-01-11 (×3): 300 mg via ORAL
  Filled 2019-01-10: qty 1

## 2019-01-10 MED ORDER — ONDANSETRON HCL 4 MG/2ML IJ SOLN
INTRAMUSCULAR | Status: DC | PRN
  Administered 2019-01-10: 4 mg via INTRAVENOUS

## 2019-01-10 MED ORDER — HYDROMORPHONE HCL 1 MG/ML IJ SOLN
1.0000 mg | INTRAMUSCULAR | Status: DC | PRN
  Administered 2019-01-11: 04:00:00 0.5 mg via INTRAVENOUS
  Filled 2019-01-10: qty 1

## 2019-01-10 MED ORDER — SCOPOLAMINE 1 MG/3DAYS TD PT72
MEDICATED_PATCH | TRANSDERMAL | Status: AC
  Filled 2019-01-10: qty 1

## 2019-01-10 MED ORDER — PROPOFOL 10 MG/ML IV BOLUS
INTRAVENOUS | Status: AC
  Filled 2019-01-10: qty 20

## 2019-01-10 MED ORDER — SUCCINYLCHOLINE CHLORIDE 200 MG/10ML IV SOSY
PREFILLED_SYRINGE | INTRAVENOUS | Status: AC
  Filled 2019-01-10: qty 10

## 2019-01-10 MED ORDER — MENTHOL 3 MG MT LOZG
1.0000 | LOZENGE | OROMUCOSAL | Status: DC | PRN
  Filled 2019-01-10: qty 9

## 2019-01-10 MED ORDER — LACTATED RINGERS IV SOLN
INTRAVENOUS | Status: DC
  Administered 2019-01-10: 12:00:00 via INTRAVENOUS
  Administered 2019-01-10: 09:00:00 75 mL/h via INTRAVENOUS
  Administered 2019-01-10: 1000 mL via INTRAVENOUS
  Filled 2019-01-10: qty 1000

## 2019-01-10 MED ORDER — KETAMINE HCL 10 MG/ML IJ SOLN
INTRAMUSCULAR | Status: DC | PRN
  Administered 2019-01-10: 30 mg via INTRAVENOUS
  Administered 2019-01-10: 10 mg via INTRAVENOUS

## 2019-01-10 MED ORDER — OXYCODONE HCL 5 MG PO TABS
5.0000 mg | ORAL_TABLET | ORAL | Status: DC | PRN
  Administered 2019-01-10: 10 mg via ORAL
  Administered 2019-01-11 (×2): 5 mg via ORAL
  Filled 2019-01-10: qty 2

## 2019-01-10 SURGICAL SUPPLY — 70 items
ADH SKN CLS APL DERMABOND .7 (GAUZE/BANDAGES/DRESSINGS) ×3
BLADE SURG 15 STRL LF DISP TIS (BLADE) IMPLANT
BLADE SURG 15 STRL SS (BLADE)
CATH ROBINSON RED A/P 16FR (CATHETERS) ×6 IMPLANT
CATH URET 5FR 28IN OPEN ENDED (CATHETERS) IMPLANT
CLOTH BEACON ORANGE TIMEOUT ST (SAFETY) ×4 IMPLANT
COVER BACK TABLE 60X90IN (DRAPES) ×4 IMPLANT
COVER MAYO STAND STRL (DRAPES) ×4 IMPLANT
COVER WAND RF STERILE (DRAPES) ×4 IMPLANT
DECANTER SPIKE VIAL GLASS SM (MISCELLANEOUS) ×2 IMPLANT
DERMABOND ADVANCED (GAUZE/BANDAGES/DRESSINGS) ×1
DERMABOND ADVANCED .7 DNX12 (GAUZE/BANDAGES/DRESSINGS) ×3 IMPLANT
DURAPREP 26ML APPLICATOR (WOUND CARE) ×4 IMPLANT
ELECT REM PT RETURN 9FT ADLT (ELECTROSURGICAL)
ELECTRODE REM PT RTRN 9FT ADLT (ELECTROSURGICAL) IMPLANT
GAUZE PACKING 2X5 YD STRL (GAUZE/BANDAGES/DRESSINGS) ×4 IMPLANT
GAUZE SPONGE 4X4 16PLY XRAY LF (GAUZE/BANDAGES/DRESSINGS) ×2 IMPLANT
GLOVE BIO SURGEON STRL SZ8 (GLOVE) ×4 IMPLANT
GLOVE BIOGEL PI IND STRL 6.5 (GLOVE) ×3 IMPLANT
GLOVE BIOGEL PI IND STRL 7.0 (GLOVE) ×9 IMPLANT
GLOVE BIOGEL PI INDICATOR 6.5 (GLOVE) ×1
GLOVE BIOGEL PI INDICATOR 7.0 (GLOVE) ×5
GLOVE ORTHO TXT STRL SZ7.5 (GLOVE) ×10 IMPLANT
GLOVE SURG SS PI 6.5 STRL IVOR (GLOVE) ×6 IMPLANT
GLOVE SURG SYN 6.5 ES PF (GLOVE) ×12 IMPLANT
GLOVE SURG SYN 6.5 PF PI (GLOVE) IMPLANT
GOWN STRL REUS W/TWL LRG LVL3 (GOWN DISPOSABLE) ×12 IMPLANT
GOWN STRL REUS W/TWL XL LVL3 (GOWN DISPOSABLE) ×4 IMPLANT
HOLDER FOLEY CATH W/STRAP (MISCELLANEOUS) ×2 IMPLANT
IV NS 1000ML (IV SOLUTION)
IV NS 1000ML BAXH (IV SOLUTION) IMPLANT
LEGGING LITHOTOMY PAIR STRL (DRAPES) ×4 IMPLANT
NDL MAYO CATGUT SZ4 TPR NDL (NEEDLE) IMPLANT
NEEDLE HYPO 22GX1.5 SAFETY (NEEDLE) IMPLANT
NEEDLE INSUFFLATION 120MM (ENDOMECHANICALS) ×4 IMPLANT
NEEDLE MAYO CATGUT SZ4 (NEEDLE) IMPLANT
NS IRRIG 1000ML POUR BTL (IV SOLUTION) ×4 IMPLANT
PACK LAVH (CUSTOM PROCEDURE TRAY) ×4 IMPLANT
PACK ROBOTIC GOWN (GOWN DISPOSABLE) ×4 IMPLANT
PACK TRENDGUARD 450 HYBRID PRO (MISCELLANEOUS) ×1 IMPLANT
PACK VAGINAL WOMENS (CUSTOM PROCEDURE TRAY) ×4 IMPLANT
PROTECTOR NERVE ULNAR (MISCELLANEOUS) ×8 IMPLANT
SET IRRIG TUBING LAPAROSCOPIC (IRRIGATION / IRRIGATOR) ×4 IMPLANT
SET IRRIG Y TYPE TUR BLADDER L (SET/KITS/TRAYS/PACK) IMPLANT
SHEARS FOC LG CVD HARMONIC 17C (MISCELLANEOUS) IMPLANT
SHEARS HARMONIC ACE PLUS 36CM (ENDOMECHANICALS) ×2 IMPLANT
SURGILUBE 2OZ TUBE FLIPTOP (MISCELLANEOUS) ×2 IMPLANT
SUT CHROMIC 1MO 4 18 CR8 (SUTURE) ×4 IMPLANT
SUT CHROMIC GUT AB #0 18 (SUTURE) IMPLANT
SUT PROLENE 1 CTX 30  8455H (SUTURE)
SUT PROLENE 1 CTX 30 8455H (SUTURE) IMPLANT
SUT SILK 0 SH 30 (SUTURE) ×2 IMPLANT
SUT SILK 2 0 SH (SUTURE) ×5 IMPLANT
SUT VIC AB 2-0 CT1 (SUTURE) ×10 IMPLANT
SUT VIC AB 2-0 CT1 27 (SUTURE) ×4
SUT VIC AB 2-0 CT1 TAPERPNT 27 (SUTURE) ×1 IMPLANT
SUT VIC AB 2-0 CT2 27 (SUTURE) ×19 IMPLANT
SUT VIC AB 3-0 CT1 27 (SUTURE)
SUT VIC AB 3-0 CT1 TAPERPNT 27 (SUTURE) IMPLANT
SUT VIC AB 3-0 SH 27 (SUTURE) ×8
SUT VIC AB 3-0 SH 27X BRD (SUTURE) IMPLANT
SUT VIC AB 4-0 PS2 18 (SUTURE) ×3 IMPLANT
SUT VICRYL 0 UR6 27IN ABS (SUTURE) IMPLANT
SUT VICRYL 4-0 PS2 18IN ABS (SUTURE) ×4 IMPLANT
TOWEL OR 17X26 10 PK STRL BLUE (TOWEL DISPOSABLE) ×7 IMPLANT
TRAY FOLEY W/BAG SLVR 14FR (SET/KITS/TRAYS/PACK) ×4 IMPLANT
TRENDGUARD 450 HYBRID PRO PACK (MISCELLANEOUS) ×4
TROCAR XCEL NON-BLD 5MMX100MML (ENDOMECHANICALS) ×12 IMPLANT
TUBING INSUF HEATED (TUBING) ×4 IMPLANT
WARMER LAPAROSCOPE (MISCELLANEOUS) ×4 IMPLANT

## 2019-01-10 NOTE — Progress Notes (Signed)
Post-op check, LAVH, A&P repair Doing ok, some pain-meds help, no n/v Afeb, VSS Adequate orange/yellow urine Abd- soft, incisions intact Continue routine care, I will remove packing and foley first thing in am

## 2019-01-10 NOTE — Anesthesia Preprocedure Evaluation (Signed)
Anesthesia Evaluation  Patient identified by MRN, date of birth, ID band Patient awake    Reviewed: Allergy & Precautions, NPO status , Patient's Chart, lab work & pertinent test results  Airway Mallampati: II  TM Distance: >3 FB     Dental  (+) Dental Advisory Given   Pulmonary former smoker,    breath sounds clear to auscultation       Cardiovascular negative cardio ROS   Rhythm:Regular Rate:Normal     Neuro/Psych  Headaches,    GI/Hepatic Neg liver ROS, GERD  ,  Endo/Other  negative endocrine ROS  Renal/GU negative Renal ROS     Musculoskeletal   Abdominal   Peds  Hematology negative hematology ROS (+)   Anesthesia Other Findings   Reproductive/Obstetrics                             Lab Results  Component Value Date   WBC 8.3 01/02/2019   HGB 14.0 01/02/2019   HCT 42.3 01/02/2019   MCV 92.8 01/02/2019   PLT 233 01/02/2019    Anesthesia Physical Anesthesia Plan  ASA: I  Anesthesia Plan: General   Post-op Pain Management:    Induction: Intravenous  PONV Risk Score and Plan: 4 or greater and Midazolam, Dexamethasone, Ondansetron, Treatment may vary due to age or medical condition and Scopolamine patch - Pre-op  Airway Management Planned: Oral ETT  Additional Equipment:   Intra-op Plan:   Post-operative Plan: Extubation in OR  Informed Consent: I have reviewed the patients History and Physical, chart, labs and discussed the procedure including the risks, benefits and alternatives for the proposed anesthesia with the patient or authorized representative who has indicated his/her understanding and acceptance.     Dental advisory given  Plan Discussed with: CRNA  Anesthesia Plan Comments:         Anesthesia Quick Evaluation

## 2019-01-10 NOTE — Transfer of Care (Signed)
Immediate Anesthesia Transfer of Care Note  Patient: Savannah Bolton  Procedure(s) Performed: ANTERIOR (CYSTOCELE) AND POSTERIOR REPAIR (RECTOCELE) (N/A Vagina ) LAPAROSCOPIC ASSISTED VAGINAL HYSTERECTOMY WITH SALPINGECTOMY (Bilateral Abdomen) CYSTOSCOPY (Bilateral Urethra)  Patient Location: PACU  Anesthesia Type:General  Level of Consciousness: awake, alert , oriented and patient cooperative  Airway & Oxygen Therapy: Patient Spontanous Breathing and Patient connected to nasal cannula oxygen  Post-op Assessment: Report given to RN and Post -op Vital signs reviewed and stable  Post vital signs: Reviewed and stable  Last Vitals:  Vitals Value Taken Time  BP    Temp    Pulse    Resp    SpO2      Last Pain:  Vitals:   01/10/19 0816  TempSrc: Oral      Patients Stated Pain Goal: 5 ([birth year] 7948)  Complications: No apparent anesthesia complications

## 2019-01-10 NOTE — Anesthesia Procedure Notes (Signed)
Procedure Name: Intubation Date/Time: 01/10/2019 10:56 AM Performed by: Wanita Chamberlain, CRNA Pre-anesthesia Checklist: Patient being monitored, Suction available, Emergency Drugs available and Patient identified Patient Re-evaluated:Patient Re-evaluated prior to induction Oxygen Delivery Method: Circle system utilized Preoxygenation: Pre-oxygenation with 100% oxygen Induction Type: IV induction Ventilation: Mask ventilation without difficulty Laryngoscope Size: Mac and 3 Grade View: Grade I Tube type: Oral Tube size: 7.0 mm Number of attempts: 1 Airway Equipment and Method: Stylet Placement Confirmation: breath sounds checked- equal and bilateral,  CO2 detector,  positive ETCO2 and ETT inserted through vocal cords under direct vision Secured at: 21 cm Tube secured with: Tape Dental Injury: Teeth and Oropharynx as per pre-operative assessment

## 2019-01-10 NOTE — OR Nursing (Signed)
IUD removed by Dr. Willis Modena

## 2019-01-10 NOTE — Interval H&P Note (Signed)
History and Physical Interval Note:  01/10/2019 9:50 AM  Savannah Bolton  has presented today for surgery, with the diagnosis of cystocele, sterilization.  The various methods of treatment have been discussed with the patient and family. After consideration of risks, benefits and other options for treatment, the patient has consented to  Procedure(s): ANTERIOR (CYSTOCELE) AND POSSIBLE POSTERIOR REPAIR (RECTOCELE) (N/A) LAPAROSCOPIC ASSISTED VAGINAL HYSTERECTOMY WITH SALPINGECTOMY (Bilateral) as a surgical intervention.  The patient's history has been reviewed, patient examined, no change in status, stable for surgery.  I have reviewed the patient's chart and labs.  Questions were answered to the patient's satisfaction.     Blane Ohara Arnola Crittendon

## 2019-01-10 NOTE — Op Note (Signed)
Preoperative diagnosis: Pelvic pain, abnormal uterine bleeding, cystocele, rectocele Postoperative diagnosis:  Same Procedure: Laparoscopic-assisted vaginal hysterectomy, bilateral salpingectomy, anterior and posterior colporrhaphy, cystoscopy Surgeon: Lavina Hammanodd Aaiden Depoy M.D. Assistant: Katherene PontoKathy RichardsonM.D. Anesthesia: Gen. Endotracheal tube Findings: She had a normal upper abdomen and pelvis. Gr 2 cystocele, Gr 1 rectocele. Estimated blood loss: 200 cc Specimens: Uterus, fallopian tubes and cervix sent for routine pathology Complications: None  Procedure in detail: The patient was taken to the operating room and placed in the dorsosupine position. General anesthesia was induced and legs were placed in mobile stirrups and arms tucked to her sides. Abdomen and perineum were then prepped and draped in usual sterile fashion, bladder drained with a red Robinson catheter, Hulka tenaculum applied to the cervix for uterine manipulation. Infraumbilical skin was then infiltrated with quarter percent Marcaine and a 1 cm vertical incision was made. Veress needle was inserted into the peritoneal cavity and placement confirmed by the water drop test an opening pressure of 6 mm mercury. CO2 was insufflated to a pressure of 13 mm mercury and the Veress needle was removed. A 5 mm trocar was introduced with direct visualization. A 5 mm port was then placed on the right side also under direct visualization. Inspection revealed the above-mentioned findings. A third 5 mm port was placed on the left side also under direct visualization. The right fallopian tube was grasped and elevated.  The Harmonic Scalpel ACE was then used to take down the right mesosalpinx, followed by the utero-ovarian pedicle, right round ligament, right broad ligament and incised the anterior peritoneum across the anterior lower part of the uterus. A similar procedure was then performed on the left side taking down the mesosalpinx, utero-ovarian pedicle,  round ligament, broad ligament.  Both uterine arteries were then secured and ligated with the harmonic scalpel ACE.Marland Kitchen. Anterior peritoneum was incised across the anterior part of the uterus to meet the incision coming from the right side. At this point the uterus was fairly free and there is adequate hemostasis to proceed vaginally.  The legs were elevated in stirrups. A weighted speculum was inserted in the vagina. The cervix was grasped with Christella HartiganJacobs tenaculums. A dilute solution of Pitressin with Marcaine was infiltrated around the cervicovaginal junction which was then incised circumferentially with electrocautery. Sharp dissection was then used to further free the vagina from the cervix. Anterior peritoneum was identified and entered sharply. A Deaver retractor was then to retract the bladder anterior. Posterior cul-de-sac was identified and entered sharply. A Bonnano speculum was placed into the posterior cul-de-sac. Uterosacral ligaments were clamped transected and ligated with #1 chromic and tagged for later use. The remaining pedicle was clamped transected and ligated and the uterus was removed. No significant bleeding from any pedicles was identified.  Attention was turned to colporrhaphy.  The vaginal cuff was grasped with 2 Allis clamps. The cystocele was then mobilized by dissecting anteriorly in the midline from the vaginal cuff to within about 2 cm of the urethral meatus in the midline. Cystocele was mobilized laterally.  Hemostasis was achieved with Bovie and 2 figure-of-eight sutures of 3-0 Vicryl.  The cystocele was then reduced with several interrupted sutures of 2-0 Vicryl with adequate reduction. Excess vaginal tissue was removed and the incision was closed with running locking 2-0 Vicryl with adequate closure and adequate hemostasis.  The Bonnano speculum was then removed and a shorter weighted speculum was placed.  Uterosacral ligaments were plicated in the midline with 2-0 silk.  The  previously tagged uterosacral sutures  were also tied in the midline.  The vaginal cuff was then closed in a vertical fashion with running locking 2-0 Vicryl with adequate closure and hemostasis.  Attention was now turned to the rectocele repair. The hymenal ring was grasped with 2 Allis clamps at a distance that when brought together would still allow two fingers to  easily pass into the vagina. A horizontal piece of tissue was removed. The posterior vaginal mucosa was then dissected in the midline from the hymenal ring to the vaginal apex. Rectocele was then mobilized bilaterally sharply and bluntly.  The rectocele was then reduced with interrupted sutures of 2-0 Vicryl after a rectal exam confirmed this was all rectocele. This achieved good reduction. A repeat rectal exam confirmed good reduction of the rectocele. Excess vaginal mucosa was then removed sharply. Vagina was closed from the vaginal apex to the hymenal ring with running locking 2-0 Vicryl. This achieved adequate closure and adequate hemostasis. The vagina was then packed with 2 inch gauze coated with lubricant. A Foley catheter was also placed.  Cystoscopy was then performed which revealed a normal bladder and good flow from each ureteral orifice.  Attention was turned back to the abdomen. The abdomen was reinsufflated. Laparoscope was reinserted and good visualization was achieved. No significant bleeding was identified. Both ureters were identified and found to be below incision lines. The 5 mm ports were removed under direct visualization. The laparoscope was then removed and all gas was allowed to deflate from the abdomen. The umbilical trocar was then removed. Skin incisions were closed with interrupted subcuticular sutures of 4-0 Vicryl followed by Dermabond. The patient was awakened in the operating room and taken to the recovery room in stable condition after tolerating the procedure well. Counts were correct x2, she received Ancef 2 g IV  the beginning of the procedure, she had PAS hose on throughout the procedure.

## 2019-01-11 ENCOUNTER — Encounter (HOSPITAL_BASED_OUTPATIENT_CLINIC_OR_DEPARTMENT_OTHER): Payer: Self-pay | Admitting: Obstetrics and Gynecology

## 2019-01-11 DIAGNOSIS — N800 Endometriosis of uterus: Secondary | ICD-10-CM | POA: Diagnosis not present

## 2019-01-11 LAB — CBC
HCT: 35.8 % — ABNORMAL LOW (ref 36.0–46.0)
Hemoglobin: 11.4 g/dL — ABNORMAL LOW (ref 12.0–15.0)
MCH: 30.2 pg (ref 26.0–34.0)
MCHC: 31.8 g/dL (ref 30.0–36.0)
MCV: 94.7 fL (ref 80.0–100.0)
Platelets: 206 10*3/uL (ref 150–400)
RBC: 3.78 MIL/uL — ABNORMAL LOW (ref 3.87–5.11)
RDW: 13 % (ref 11.5–15.5)
WBC: 12.7 10*3/uL — ABNORMAL HIGH (ref 4.0–10.5)
nRBC: 0 % (ref 0.0–0.2)

## 2019-01-11 MED ORDER — ACETAMINOPHEN 500 MG PO TABS
ORAL_TABLET | ORAL | Status: AC
  Filled 2019-01-11: qty 2

## 2019-01-11 MED ORDER — OXYCODONE HCL 5 MG PO TABS
ORAL_TABLET | ORAL | Status: AC
  Filled 2019-01-11: qty 1

## 2019-01-11 MED ORDER — KETOROLAC TROMETHAMINE 30 MG/ML IJ SOLN
INTRAMUSCULAR | Status: AC
  Filled 2019-01-11: qty 1

## 2019-01-11 MED ORDER — IBUPROFEN 800 MG PO TABS: 800.0000 mg | ORAL_TABLET | Freq: Four times a day (QID) | ORAL | 0 refills | Status: AC

## 2019-01-11 MED ORDER — GABAPENTIN 300 MG PO CAPS: 300.0000 mg | ORAL_CAPSULE | Freq: Three times a day (TID) | ORAL | 0 refills | Status: AC

## 2019-01-11 MED ORDER — GABAPENTIN 300 MG PO CAPS
ORAL_CAPSULE | ORAL | Status: AC
  Filled 2019-01-11: qty 1

## 2019-01-11 MED ORDER — OXYCODONE HCL 5 MG PO TABS: 5.0000 mg | ORAL_TABLET | ORAL | 0 refills | Status: AC | PRN

## 2019-01-11 MED ORDER — HYDROMORPHONE HCL 1 MG/ML IJ SOLN
INTRAMUSCULAR | Status: AC
  Filled 2019-01-11: qty 1

## 2019-01-11 NOTE — Anesthesia Postprocedure Evaluation (Signed)
Anesthesia Post Note  Patient: Savannah Bolton  Procedure(s) Performed: ANTERIOR (CYSTOCELE) AND POSTERIOR REPAIR (RECTOCELE) (N/A Vagina ) LAPAROSCOPIC ASSISTED VAGINAL HYSTERECTOMY WITH SALPINGECTOMY (Bilateral Abdomen) CYSTOSCOPY (Bilateral Urethra)     Patient location during evaluation: PACU Anesthesia Type: General Level of consciousness: awake and alert Pain management: pain level controlled Vital Signs Assessment: post-procedure vital signs reviewed and stable Respiratory status: spontaneous breathing, nonlabored ventilation, respiratory function stable and patient connected to nasal cannula oxygen Cardiovascular status: blood pressure returned to baseline and stable Postop Assessment: no apparent nausea or vomiting Anesthetic complications: no    Last Vitals:  Vitals:   01/11/19 0543 01/11/19 0815  BP: (!) 96/55 100/69  Pulse: 67 61  Resp: 17 16  Temp: 37.2 C 37 C  SpO2: 98% 98%    Last Pain:  Vitals:   01/11/19 0815  TempSrc:   PainSc: 2                  Tiajuana Amass

## 2019-01-11 NOTE — Discharge Summary (Signed)
Physician Discharge Summary  Patient ID: Savannah Bolton MRN: [id number] DOB/AGE: May 26, 1986 33 y.o.  Admit date: 01/10/2019 Discharge date: 01/11/2019  Admission Diagnoses:  Cystocele, rectocele, pelvic pain, AUB  Discharge Diagnoses: Same Active Problems:   Cystocele with prolapse   S/P laparoscopic assisted vaginal hysterectomy (LAVH)   Discharged Condition: good  Hospital Course: Admitted for LAVH, bilateral salpingectomy, A&P repair, cystoscopy.  Surgery without incident.  No postop issues.  Vaginal packing and foley removed POD #1, she was able to void and stable for discharge.   Discharge Exam: Blood pressure 100/69, pulse 61, temperature 98.6 F (37 C), resp. rate 16, weight 70.1 kg, last menstrual period 12/19/2018, SpO2 98 %. General appearance: alert  Disposition: Discharge disposition: 01-Home or Self Care       Discharge Instructions    Call MD for:  difficulty breathing, headache or visual disturbances   Complete by: As directed    Call MD for:  extreme fatigue   Complete by: As directed    Call MD for:  persistant dizziness or light-headedness   Complete by: As directed    Call MD for:  persistant nausea and vomiting   Complete by: As directed    Call MD for:  severe uncontrolled pain   Complete by: As directed    Call MD for:  temperature >100.4   Complete by: As directed    Diet - low sodium heart healthy   Complete by: As directed    Increase activity slowly   Complete by: As directed    Lifting restrictions   Complete by: As directed    10 lbs   Sexual Activity Restrictions   Complete by: As directed    Pelvic rest     Allergies as of 01/11/2019   No Known Allergies     Medication List    TAKE these medications   gabapentin 300 MG capsule Commonly known as: NEURONTIN Take 1 capsule (300 mg total) by mouth 3 (three) times daily.   ibuprofen 800 MG tablet Commonly known as: ADVIL Take 800 mg by mouth as needed. What changed: Another  medication with the same name was added. Make sure you understand how and when to take each. Notes to patient: Had dose of toradol at 5:45am. Can have dose of ibuprofen at 11:45am if needed.    ibuprofen 800 MG tablet Commonly known as: ADVIL Take 1 tablet (800 mg total) by mouth every 6 (six) hours. What changed: You were already taking a medication with the same name, and this prescription was added. Make sure you understand how and when to take each.   oxyCODONE 5 MG immediate release tablet Commonly known as: Oxy IR/ROXICODONE Take 1 tablet (5 mg total) by mouth every 4 (four) hours as needed for severe pain.   SUMAtriptan 50 MG tablet Commonly known as: IMITREX Take 50 mg by mouth every 2 (two) hours as needed for migraine. May repeat in 2 hours if headache persists or recurs.      Follow-up Information    Kendalyn Cranfield, MD. Schedule an appointment as soon as possible for a visit in 2 week(s).   Specialty: Obstetrics and Gynecology Contact information: [address], [place] [address] [place] [address] [phone]           Signed: Blane Ohara Argie Applegate 01/11/2019, 8:44 AM

## 2019-01-11 NOTE — Discharge Instructions (Signed)
Routine instructions for LAVH 

## 2019-01-11 NOTE — Progress Notes (Signed)
POD #1, LAVH, A&P repair Doing well, pain ok with meds, no n/v Afeb, VSS Abd- soft Vaginal packing and foley removed Hgb 14.0 to 11.4 Will make sure able to void, then d/c home

## 2019-01-12 NOTE — Progress Notes (Signed)
0.5mg of dilaudid syringe wasted in sink. Witnessed by Mary Hollis, RN. Kiarra Kidd M Katasha Riga, RN  

## 2019-02-08 ENCOUNTER — Encounter (HOSPITAL_BASED_OUTPATIENT_CLINIC_OR_DEPARTMENT_OTHER): Payer: Self-pay | Admitting: Obstetrics and Gynecology

## 2020-04-02 ENCOUNTER — Encounter: Payer: Self-pay | Admitting: General Practice

## 2021-03-03 ENCOUNTER — Other Ambulatory Visit: Payer: Self-pay | Admitting: Physician Assistant

## 2021-03-03 DIAGNOSIS — R1319 Other dysphagia: Secondary | ICD-10-CM

## 2021-03-03 DIAGNOSIS — R1013 Epigastric pain: Secondary | ICD-10-CM

## 2021-03-11 ENCOUNTER — Other Ambulatory Visit: Payer: Self-pay

## 2021-03-11 ENCOUNTER — Ambulatory Visit
Admission: RE | Admit: 2021-03-11 | Discharge: 2021-03-11 | Disposition: A | Discharge status: Home / Self Care | Payer: 59 | Source: Ambulatory Visit | Attending: Physician Assistant | Admitting: Physician Assistant

## 2021-03-11 DIAGNOSIS — R1013 Epigastric pain: Secondary | ICD-10-CM

## 2021-03-11 DIAGNOSIS — R1319 Other dysphagia: Secondary | ICD-10-CM

## 2021-04-22 ENCOUNTER — Other Ambulatory Visit: Payer: Self-pay | Admitting: Physician Assistant

## 2021-04-22 DIAGNOSIS — R1319 Other dysphagia: Secondary | ICD-10-CM

## 2021-05-13 ENCOUNTER — Other Ambulatory Visit: Payer: Self-pay

## 2021-05-13 ENCOUNTER — Ambulatory Visit
Admission: RE | Admit: 2021-05-13 | Discharge: 2021-05-13 | Disposition: A | Discharge status: Home / Self Care | Payer: 59 | Source: Ambulatory Visit | Attending: Physician Assistant | Admitting: Physician Assistant

## 2021-05-13 DIAGNOSIS — R1319 Other dysphagia: Secondary | ICD-10-CM

## 2021-05-13 MED ORDER — IOPAMIDOL (ISOVUE-300) INJECTION 61%
100.0000 mL | Freq: Once | INTRAVENOUS | Status: AC | PRN
  Administered 2021-05-13: 100 mL via INTRAVENOUS

## 2022-08-12 ENCOUNTER — Other Ambulatory Visit (HOSPITAL_COMMUNITY): Payer: Self-pay

## 2022-08-12 MED ORDER — ONDANSETRON HCL 4 MG PO TABS
4.0000 mg | ORAL_TABLET | Freq: Three times a day (TID) | ORAL | 2 refills | Status: AC | PRN
  Filled 2022-08-12: qty 9, 3d supply, fill #0

## 2022-08-12 MED ORDER — WEGOVY 0.25 MG/0.5ML ~~LOC~~ SOAJ
0.2500 mg | SUBCUTANEOUS | 0 refills | Status: AC
  Filled 2022-08-12: qty 2, 28d supply, fill #0

## 2022-08-19 ENCOUNTER — Other Ambulatory Visit (HOSPITAL_COMMUNITY): Payer: Self-pay

## 2022-08-31 ENCOUNTER — Other Ambulatory Visit (HOSPITAL_COMMUNITY): Payer: Self-pay

## 2022-09-14 ENCOUNTER — Other Ambulatory Visit (HOSPITAL_COMMUNITY): Payer: Self-pay

## 2023-01-25 ENCOUNTER — Telehealth: Payer: Self-pay | Admitting: Gastroenterology

## 2023-01-25 NOTE — Telephone Encounter (Signed)
Patient called requesting a  transfer of care over to Dr. Meridee Score from Dr. Dulce Sellar for a second opinion. Requested records for review.

## 2023-02-08 NOTE — Telephone Encounter (Signed)
I heard about this patient from one of our former Endo Techs and the patient's family member (niece). I am happy to evaluate this patient but would like to review her [facility] records and all of her endoscopic evaluation. Please get this as soon as able and I can review. She can be scheduled next available for clinic visit with me

## 2023-04-08 ENCOUNTER — Other Ambulatory Visit: Payer: Self-pay | Admitting: Nurse Practitioner

## 2023-04-08 DIAGNOSIS — M545 Low back pain, unspecified: Secondary | ICD-10-CM

## 2023-04-26 ENCOUNTER — Ambulatory Visit
Admission: RE | Admit: 2023-04-26 | Discharge: 2023-04-26 | Disposition: A | Discharge status: Home / Self Care | Payer: Worker's Compensation | Source: Ambulatory Visit | Attending: Nurse Practitioner | Admitting: Nurse Practitioner

## 2023-04-26 DIAGNOSIS — M545 Low back pain, unspecified: Secondary | ICD-10-CM

## 2023-09-21 ENCOUNTER — Other Ambulatory Visit: Payer: Self-pay | Admitting: Medical Genetics

## 2024-04-29 ENCOUNTER — Other Ambulatory Visit: Payer: Self-pay | Admitting: Medical Genetics

## 2024-04-29 DIAGNOSIS — Z006 Encounter for examination for normal comparison and control in clinical research program: Secondary | ICD-10-CM
